# Patient Record
Sex: Female | Born: 1935 | Race: White | Hispanic: No | Smoking: Never smoker
Health system: Southern US, Community
[De-identification: ages and names within clinical notes are randomized; demographics above are authoritative.]

## PROBLEM LIST (undated history)

## (undated) DIAGNOSIS — I739 Peripheral vascular disease, unspecified: Secondary | ICD-10-CM

## (undated) DIAGNOSIS — M1990 Unspecified osteoarthritis, unspecified site: Secondary | ICD-10-CM

## (undated) DIAGNOSIS — E7800 Pure hypercholesterolemia, unspecified: Secondary | ICD-10-CM

## (undated) DIAGNOSIS — I10 Essential (primary) hypertension: Secondary | ICD-10-CM

## (undated) HISTORY — DX: Peripheral vascular disease, unspecified: I73.9

## (undated) HISTORY — PX: TONSILLECTOMY: SUR1361

## (undated) HISTORY — DX: Unspecified osteoarthritis, unspecified site: M19.90

## (undated) HISTORY — DX: Essential (primary) hypertension: I10

## (undated) HISTORY — DX: Pure hypercholesterolemia, unspecified: E78.00

---

## 2010-08-13 ENCOUNTER — Ambulatory Visit: Payer: Self-pay

## 2011-05-26 ENCOUNTER — Encounter (INDEPENDENT_AMBULATORY_CARE_PROVIDER_SITE_OTHER): Payer: Self-pay | Admitting: General Surgery

## 2011-05-26 ENCOUNTER — Ambulatory Visit (INDEPENDENT_AMBULATORY_CARE_PROVIDER_SITE_OTHER): Payer: Medicare Other | Admitting: General Surgery

## 2011-05-26 VITALS — BP 119/64 | HR 57 | Ht 61.0 in | Wt 137.0 lb

## 2011-05-26 DIAGNOSIS — K420 Umbilical hernia with obstruction, without gangrene: Secondary | ICD-10-CM

## 2011-05-26 NOTE — Progress Notes (Addendum)
Chief Complaint  Patient presents with  . hernia    HPI [place]. [place] is a 77 y.o. female.    This patient is referred to me through the courtesy of Dr. Leo Grosser at [facility]. She is referred for evaluation and surgical management of an incarcerated periumbilical ventral hernia.  The patient is 77 years old and has enjoyed fairly good health. She is originally from [place] but her daughter lives in [place]. She recently moved to [place] [facility] to be near her daughter. She denies any history of hernia. She denies any perception of mass or pain in her abdominal wall. When she went for a initial history and physical with Dr. Elease Hashimoto, Dr. Elease Hashimoto evaluated her hyperlipidemia, hypertension, and mild thrombocytopenia. The patient also has worsening arthritis. Dr. Elease Hashimoto also noted a palpable mass in the periumbilical area consistent with an incarcerated hernia.  The patient's complete metabolic panel and CBC are basically normal except for a platelet count of 119,000.  Abdominal ultrasound shows a palpable abnormality in the periumbilical area containing probable intestine. Small kidneys were noted. The aorta and vena cava looked normal. The gallbladder was normal. The liver looked normal. The bile duct was normal.  The patient remains asymptomatic but is here with her daughter for an evaluation. HPI  Past Medical History  Diagnosis Date  . Hypercholesterolemia   . Hypertension   . PAD (peripheral artery disease)   . Arthritis     hands    Past Surgical History  Procedure Date  . Tonsillectomy 77 years old    Family History  Problem Relation Age of Onset  . Heart attack Father   . Heart attack Brother   . Diabetes Daughter     Social History History  Substance Use Topics  . Smoking status: Never Smoker   . Smokeless tobacco: Not on file  . Alcohol Use: No    No Known Allergies  Current Outpatient Prescriptions  Medication Sig Dispense Refill    . aspirin 81 MG tablet Take 81 mg by mouth daily.        Marland Kitchen atenolol (TENORMIN) 100 MG tablet Take 100 mg by mouth daily.        Marland Kitchen atorvastatin (LIPITOR) 10 MG tablet Take 10 mg by mouth daily.        . hydrochlorothiazide 25 MG tablet Take 25 mg by mouth daily. Half tablet      . Multiple Vitamin (MULTIVITAMIN PO) Take by mouth daily.        Marland Kitchen oxybutynin (DITROPAN-XL) 5 MG 24 hr tablet Take 5 mg by mouth daily.        . Potassium 75 MG TABS Take by mouth daily.        . traMADol (ULTRAM) 50 MG tablet Take 50 mg by mouth every 6 (six) hours as needed.          Review of Systems Review of Systems  Constitutional: Negative.   HENT: Negative.   Eyes: Negative.   Respiratory: Negative.   Cardiovascular: Negative.   Gastrointestinal: Negative.   Genitourinary: Negative.   Musculoskeletal: Negative.   Skin: Negative.   Neurological: Negative.   Endo/Heme/Allergies: Negative.   Psychiatric/Behavioral: Negative.     Blood pressure 119/64, pulse 57, height 5\' 1"  (1.549 m), weight 137 lb (62.143 kg).  Physical Exam Physical Exam  Constitutional: She is oriented to person, place, and time. She appears well-developed and well-nourished. No distress.  HENT:  Head: Normocephalic and atraumatic.  Nose: Nose normal.  Mouth/Throat: No oropharyngeal exudate.       Dentures.  Eyes: Conjunctivae are normal. Pupils are equal, round, and reactive to light. Right eye exhibits no discharge. Left eye exhibits no discharge. No scleral icterus.  Neck: Normal range of motion. Neck supple. No JVD present. No tracheal deviation present. No thyromegaly present.  Cardiovascular: Normal rate, regular rhythm and normal heart sounds.   No murmur heard. Respiratory: Effort normal and breath sounds normal. No respiratory distress. She has no wheezes. She has no rales. She exhibits no tenderness.  GI: Soft. Bowel sounds are normal. She exhibits mass. She exhibits no distension. There is no tenderness. There is  no rebound and no guarding.    Musculoskeletal: She exhibits no edema and no tenderness.  Neurological: She is alert and oriented to person, place, and time. She exhibits normal muscle tone.  Skin: Skin is warm and dry. No rash noted. She is not diaphoretic. No erythema. No pallor.  Psychiatric: She has a normal mood and affect. Her behavior is normal. Judgment and thought content normal.     Data Reviewed I have reviewed Dr. Santiago Bur office notes, the lab work, and the ultrasound report.  Assessment    Incarcerated periumbilical ventral hernia. This is quite large and almost certainly contains nonobstructive bowel. She is at increased risk for obstruction and strangulation the future. Fortunately, she is asymptomatic today.  Hyperlipidemia.  Hypertension  Mild,  borderline thrombocytopenia.  History of venous thrombosis left leg, prior Coumadin therapy, none for 10 years.    Plan    I have advised the patient to consider elective open repair of her ventral hernia, probably with mesh. I have told her that she is at risk for emergency surgical problems from bowel obstruction and/or strangulation and gangrene.I told her it would be in her best interest to have this done electively rather than wait for an emergent surgical complication.  I have told her that I would like to do a CT scan of the abdomen and pelvis prior to this surgery to define the extent of the hernia, the nature of the bowel caught in the hernia, and to rule out other disorders since she has never had any GI workup in the past and has never had a colonoscopy.  I have discussed the indications and details of the surgery the patient and her daughter. Risks and complications have been outlined, including but not limited to bleeding, infection, recurrence of the hernia, and injury to the intestine was major reconstructive surgery, cardiac pulmonary and thromboembolic problems. She seems to understand all these issues quite  well. All of her questions were answered.  She is going to go home and think about this for 24-48 hours and call me back with her final decision. I suspect that she will eventually want to have this done electively, and we will be happy to provide that service.    ADDENDUM:    The CT scan performed on Sept. 4, 2012  shows a supraumbilical midline abdominal wall hernia containing fat only. This is incarcerated. She has a tiny right inguinal hernia containing fat only. She has sigmoid diverticulosis. Otherwise the CT scan is normal.  The patient's daughter has called and stated that she would like to have the patient scheduled for surgery.  We will go ahead and schedule her for open repair of her incarcerated ventral hernia with mesh. This can be scheduled at the patient's convenience.there is no indication for repair of the tiny right inguinal hernia.  Bran Aldridge M 05/26/2011, 3:00 PM

## 2011-05-26 NOTE — Patient Instructions (Addendum)
Call us when you are ready to schedule surgery. [phone]. Stay off any aspirin or blood thinning products for 5 days prior to surgery. If you decide to have the surgery, we will schedule you for a CT scan of the abdomen and pelvis prior to the surgery.

## 2011-05-29 ENCOUNTER — Telehealth (INDEPENDENT_AMBULATORY_CARE_PROVIDER_SITE_OTHER): Payer: Self-pay | Admitting: General Surgery

## 2011-05-29 NOTE — Telephone Encounter (Signed)
I have lmom for pts daughter that I am trying to return her call. Pt needs CT preop.

## 2011-06-01 ENCOUNTER — Telehealth (INDEPENDENT_AMBULATORY_CARE_PROVIDER_SITE_OTHER): Payer: Self-pay

## 2011-06-01 ENCOUNTER — Other Ambulatory Visit (INDEPENDENT_AMBULATORY_CARE_PROVIDER_SITE_OTHER): Payer: Self-pay | Admitting: General Surgery

## 2011-06-01 DIAGNOSIS — K439 Ventral hernia without obstruction or gangrene: Secondary | ICD-10-CM

## 2011-06-01 NOTE — Telephone Encounter (Signed)
Pts daughter aware CT at [facility] [phone] 9-4 @ 10:15 and to have labs drawn 8-31 at [facility] and pick up contrast.

## 2011-06-05 ENCOUNTER — Other Ambulatory Visit (INDEPENDENT_AMBULATORY_CARE_PROVIDER_SITE_OTHER): Payer: Self-pay | Admitting: General Surgery

## 2011-06-05 LAB — CREATININE, SERUM: Creat: 0.87 mg/dL (ref 0.50–1.10)

## 2011-06-09 ENCOUNTER — Ambulatory Visit
Admission: RE | Admit: 2011-06-09 | Discharge: 2011-06-09 | Disposition: A | Discharge status: Home / Self Care | Payer: Medicare Other | Source: Ambulatory Visit | Attending: General Surgery | Admitting: General Surgery

## 2011-06-09 DIAGNOSIS — K439 Ventral hernia without obstruction or gangrene: Secondary | ICD-10-CM

## 2011-06-09 MED ORDER — IOHEXOL 300 MG/ML  SOLN
100.0000 mL | Freq: Once | INTRAMUSCULAR | Status: AC | PRN
  Administered 2011-06-09: 100 mL via INTRAVENOUS

## 2011-06-10 ENCOUNTER — Telehealth (INDEPENDENT_AMBULATORY_CARE_PROVIDER_SITE_OTHER): Payer: Self-pay

## 2011-06-10 NOTE — Telephone Encounter (Signed)
I called to advise pts daughter, [place] that the CT was reviewed by Dr Derrell Lolling and we can proceed with ventral hernia repair. She advised me that pt now has an area on leg that she is concerned may be another thrombosis. I have advised her that pt needs to see PCP asap to have this evaluated. I advised this should be done before we schedule surgery. The daughter will have pt see her PCP and will have them call me when ok to proceed with surgery.

## 2011-06-17 ENCOUNTER — Telehealth (INDEPENDENT_AMBULATORY_CARE_PROVIDER_SITE_OTHER): Payer: Self-pay

## 2011-06-17 NOTE — Telephone Encounter (Signed)
I have left msg for Inocencio Homes, pts daughter to call with update on pts status [id number] up for thrombosis. We are still holding surgery orders awaiting call back.

## 2011-06-18 ENCOUNTER — Telehealth (INDEPENDENT_AMBULATORY_CARE_PROVIDER_SITE_OTHER): Payer: Self-pay

## 2011-06-18 NOTE — Telephone Encounter (Signed)
Pts daughter called to give update. Pt has appt for 9-21 to have possible new lower leg thrombosis workup. She will call back once pt is cleared for surgery.

## 2011-07-24 ENCOUNTER — Telehealth (INDEPENDENT_AMBULATORY_CARE_PROVIDER_SITE_OTHER): Payer: Self-pay | Admitting: General Surgery

## 2011-07-24 NOTE — Telephone Encounter (Signed)
Pt daughter Inocencio Homes) called from [phone]. Asked to speak with [facility]. She wanted to know if information from Dr. Marcha Dutton [facility]) sent any information over for review in order to set her up for surgery. Stated she had a history of varicose veins and clots. Advised her that Arline Asp was out of the office this afternoon, and that I would forward the information to her as she was the best person to answer that question. She stated she can be called back on cell # [phone] (to be tried first) or home # [phone].

## 2011-07-27 ENCOUNTER — Telehealth (INDEPENDENT_AMBULATORY_CARE_PROVIDER_SITE_OTHER): Payer: Self-pay

## 2011-07-27 NOTE — Telephone Encounter (Signed)
I have reviewed the system and find no clearance from Dr Marcha Dutton. Inocencio Homes advised to have there office send clearance note [id number] leg r/s dvt . She will have them fax clearance note.

## 2011-08-17 ENCOUNTER — Encounter (INDEPENDENT_AMBULATORY_CARE_PROVIDER_SITE_OTHER): Payer: Medicare Other | Admitting: General Surgery

## 2011-08-18 ENCOUNTER — Ambulatory Visit (HOSPITAL_COMMUNITY): Admission: RE | Admit: 2011-08-18 | Payer: Medicare Other | Source: Ambulatory Visit | Admitting: General Surgery

## 2011-08-18 ENCOUNTER — Encounter (HOSPITAL_COMMUNITY): Admission: RE | Payer: Self-pay | Source: Ambulatory Visit

## 2011-08-18 SURGERY — REPAIR, HERNIA, VENTRAL
Anesthesia: General

## 2012-05-19 ENCOUNTER — Ambulatory Visit: Payer: Self-pay | Admitting: Family Medicine

## 2012-06-15 ENCOUNTER — Ambulatory Visit: Payer: Self-pay | Admitting: Vascular Surgery

## 2012-08-25 ENCOUNTER — Emergency Department: Payer: Self-pay | Admitting: Emergency Medicine

## 2012-08-25 LAB — COMPREHENSIVE METABOLIC PANEL
Albumin: 4 g/dL (ref 3.4–5.0)
Alkaline Phosphatase: 64 U/L (ref 50–136)
Calcium, Total: 9.7 mg/dL (ref 8.5–10.1)
Chloride: 104 mmol/L (ref 98–107)
Co2: 33 mmol/L — ABNORMAL HIGH (ref 21–32)
SGOT(AST): 30 U/L (ref 15–37)
SGPT (ALT): 19 U/L (ref 12–78)

## 2012-08-25 LAB — CBC
HCT: 39 % (ref 35.0–47.0)
MCV: 87 fL (ref 80–100)
WBC: 6.6 10*3/uL (ref 3.6–11.0)

## 2012-08-25 LAB — URINALYSIS, COMPLETE
Nitrite: NEGATIVE
Protein: NEGATIVE
Specific Gravity: 1.014 (ref 1.003–1.030)

## 2012-08-25 LAB — CK TOTAL AND CKMB (NOT AT ARMC)
CK, Total: 80 U/L (ref 21–215)
CK-MB: 2 ng/mL (ref 0.5–3.6)

## 2012-08-27 LAB — URINE CULTURE

## 2012-09-05 ENCOUNTER — Ambulatory Visit: Payer: Self-pay | Admitting: Vascular Surgery

## 2012-09-05 LAB — CBC
HCT: 36.6 % (ref 35.0–47.0)
HGB: 12.5 g/dL (ref 12.0–16.0)
MCV: 87 fL (ref 80–100)
Platelet: 113 10*3/uL — ABNORMAL LOW (ref 150–440)

## 2012-09-05 LAB — BASIC METABOLIC PANEL
Chloride: 106 mmol/L (ref 98–107)
Co2: 30 mmol/L (ref 21–32)
Creatinine: 0.69 mg/dL (ref 0.60–1.30)

## 2012-09-09 ENCOUNTER — Inpatient Hospital Stay: Payer: Self-pay | Admitting: Vascular Surgery

## 2012-09-09 LAB — POTASSIUM: Potassium: 3.3 mmol/L — ABNORMAL LOW (ref 3.5–5.1)

## 2012-09-10 LAB — CBC WITH DIFFERENTIAL/PLATELET
MCV: 86 fL (ref 80–100)
Monocyte %: 7.7 %
Platelet: 100 10*3/uL — ABNORMAL LOW (ref 150–440)
RDW: 13.7 % (ref 11.5–14.5)
WBC: 10.4 10*3/uL (ref 3.6–11.0)

## 2012-09-10 LAB — BASIC METABOLIC PANEL
Calcium, Total: 8.2 mg/dL — ABNORMAL LOW (ref 8.5–10.1)
Co2: 30 mmol/L (ref 21–32)
EGFR (African American): >60
Sodium: 144 mmol/L (ref 136–145)

## 2012-09-12 LAB — PATHOLOGY REPORT

## 2012-09-14 ENCOUNTER — Observation Stay: Payer: Self-pay | Admitting: Vascular Surgery

## 2012-09-15 LAB — CBC WITH DIFFERENTIAL/PLATELET
Eosinophil %: 0.3 %
HCT: 29.7 % — ABNORMAL LOW (ref 35.0–47.0)
Lymphocyte #: 0.8 10*3/uL — ABNORMAL LOW (ref 1.0–3.6)
MCH: 30.1 pg (ref 26.0–34.0)
MCV: 87 fL (ref 80–100)
Monocyte #: 0.6 x10 3/mm (ref 0.2–0.9)
Neutrophil #: 5.2 10*3/uL (ref 1.4–6.5)
Platelet: 122 10*3/uL — ABNORMAL LOW (ref 150–440)
RBC: 3.43 10*6/uL — ABNORMAL LOW (ref 3.80–5.20)
RDW: 13.4 % (ref 11.5–14.5)

## 2012-09-15 LAB — BASIC METABOLIC PANEL
BUN: 11 mg/dL (ref 7–18)
Creatinine: 0.71 mg/dL (ref 0.60–1.30)
EGFR (African American): >60
EGFR (Non-African Amer.): >60
Potassium: 4.2 mmol/L (ref 3.5–5.1)
Sodium: 141 mmol/L (ref 136–145)

## 2012-09-16 LAB — BASIC METABOLIC PANEL
Anion Gap: 6 — ABNORMAL LOW (ref 7–16)
BUN: 6 mg/dL — ABNORMAL LOW (ref 7–18)
Calcium, Total: 8.9 mg/dL (ref 8.5–10.1)
EGFR (African American): >60
EGFR (Non-African Amer.): >60
Osmolality: 285 (ref 275–301)

## 2012-09-16 LAB — CBC WITH DIFFERENTIAL/PLATELET
Basophil %: 0.2 %
Eosinophil #: 0 10*3/uL (ref 0.0–0.7)
Eosinophil %: 0.1 %
HCT: 32.5 % — ABNORMAL LOW (ref 35.0–47.0)
HGB: 11 g/dL — ABNORMAL LOW (ref 12.0–16.0)
Lymphocyte %: 15.3 %
MCH: 29.3 pg (ref 26.0–34.0)
MCHC: 33.9 g/dL (ref 32.0–36.0)
Neutrophil #: 5.4 10*3/uL (ref 1.4–6.5)
RBC: 3.76 10*6/uL — ABNORMAL LOW (ref 3.80–5.20)

## 2014-02-21 IMAGING — CT CT HEAD WITHOUT CONTRAST
2 series · 16 of 30 positions shown, 20 images · non-contrast
Comparison: none

REASON FOR EXAM: weakness/confusion
COMMENTS:

PROCEDURE:     CT  - CT HEAD WITHOUT CONTRAST  - August 25, 2012  [DATE]
RESULT:     Comparison:  None
TECHNIQUE: Multiple axial images from the foramen magnum to the vertex were
obtained without IV contrast.

[Series 2: without · axial · non-contrast · 0.39mm/px · z∈[+6,+126]mm · 13 of 29 slices shown, 17 images]
[im 3/29  brain]
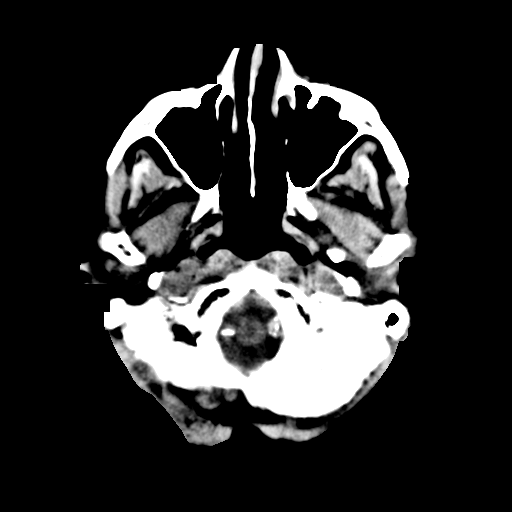
[im 3/29  bone]
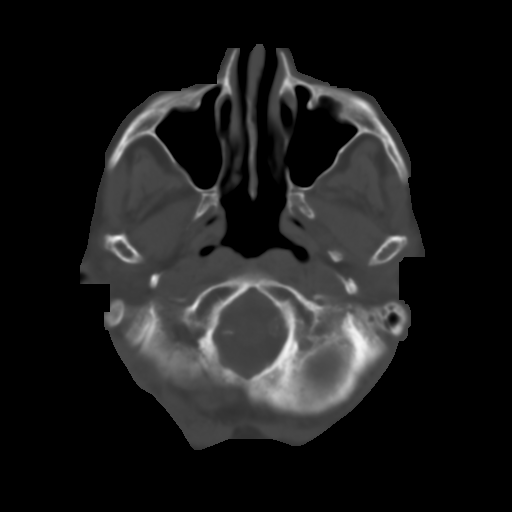
[im 5/29  brain]
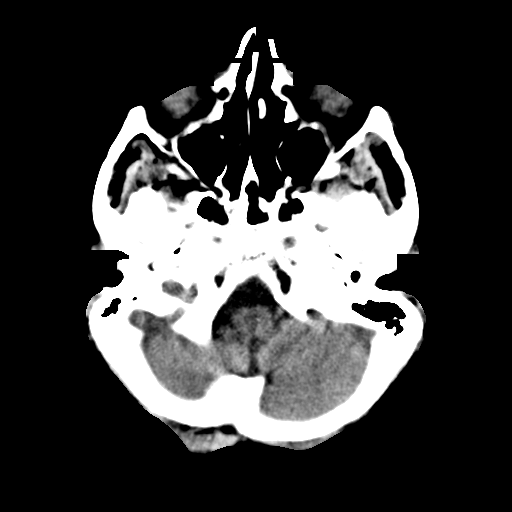
[im 7/29  brain]
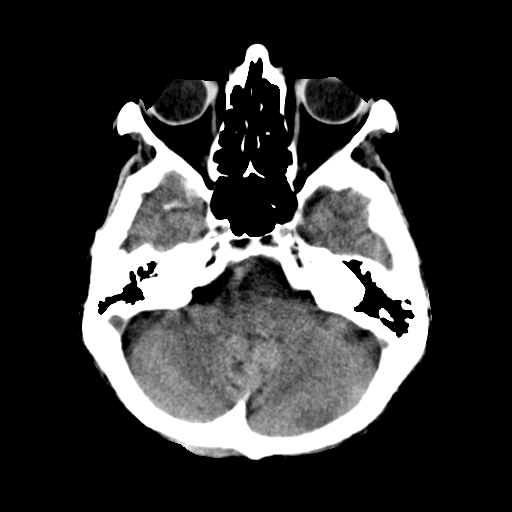
[im 9/29  brain]
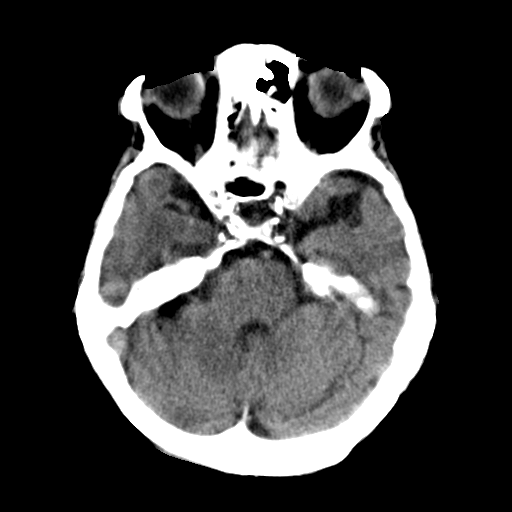
[im 11/29  brain]
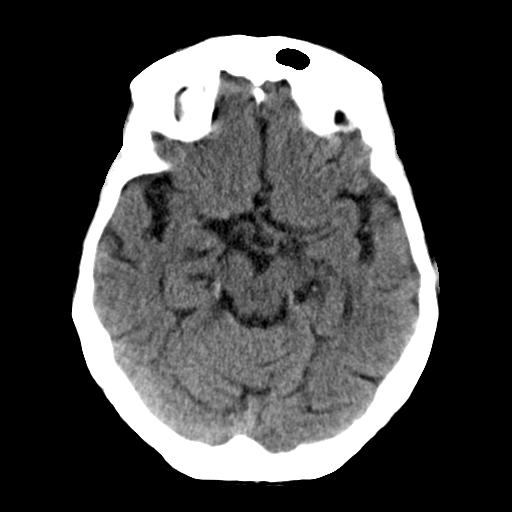
[im 11/29  bone]
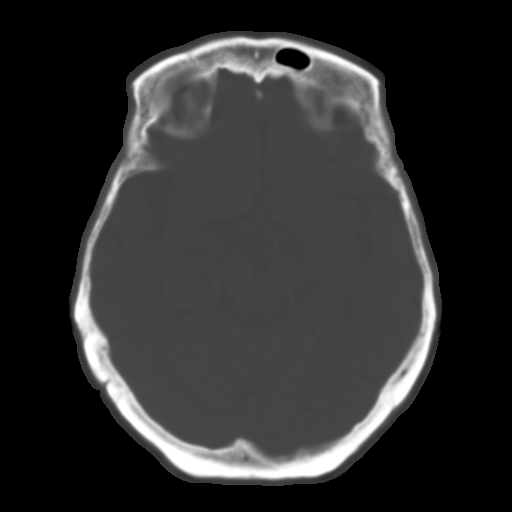
[im 13/29  brain]
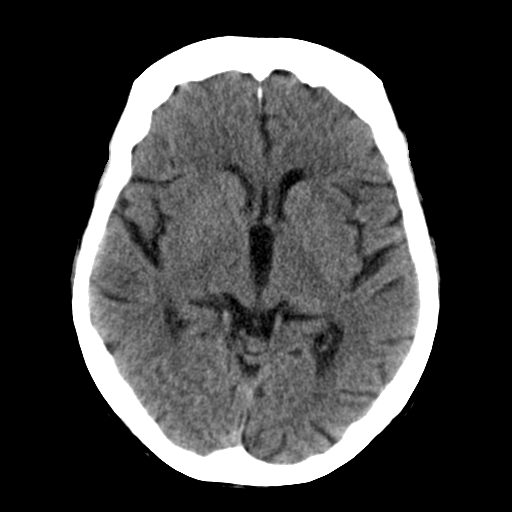
[im 15/29  brain]
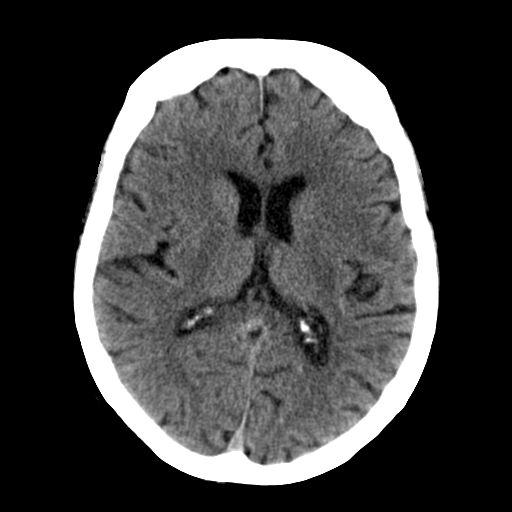
[im 17/29  brain]
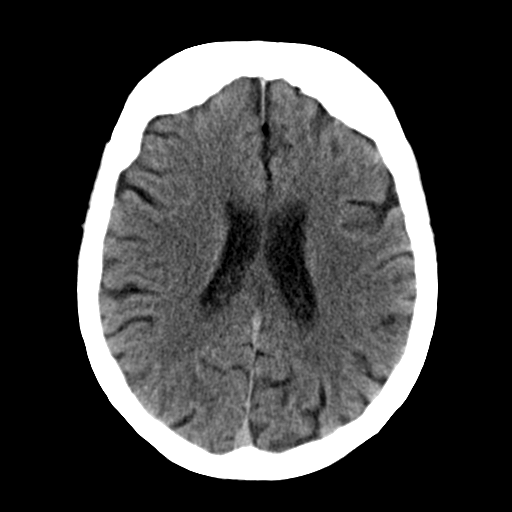
[im 19/29  brain]
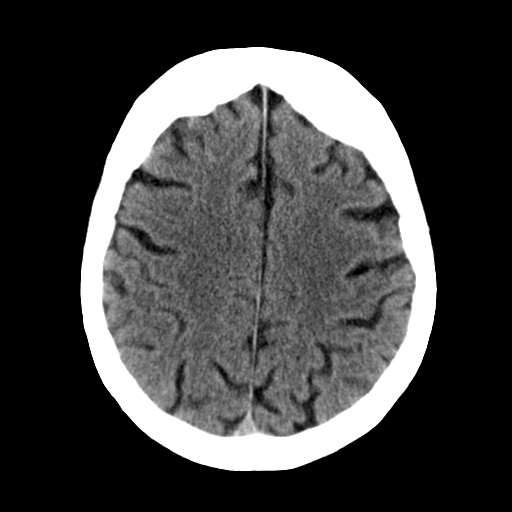
[im 19/29  bone]
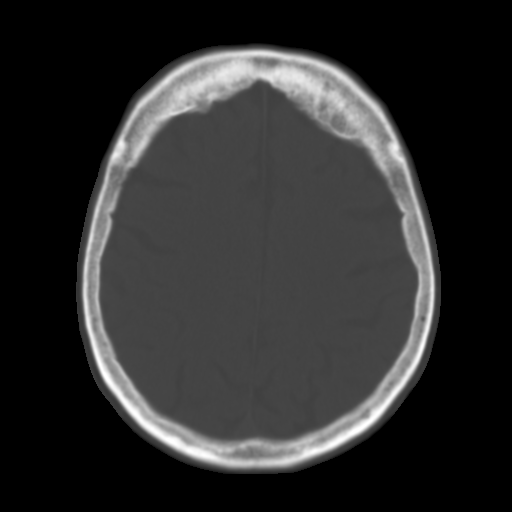
[im 21/29  brain]
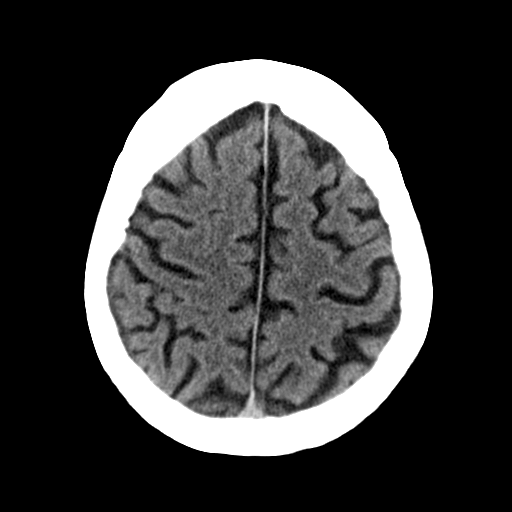
[im 23/29  brain]
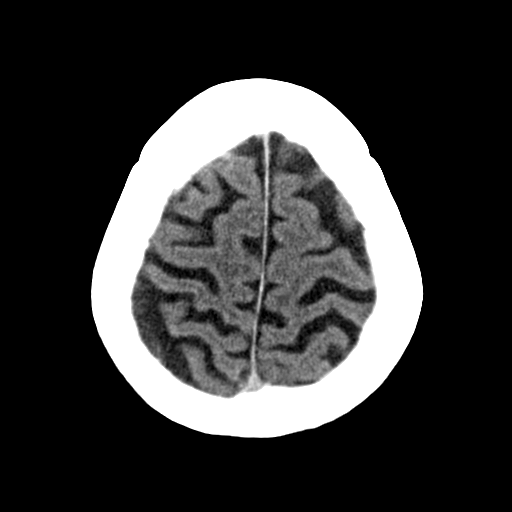
[im 25/29  brain]
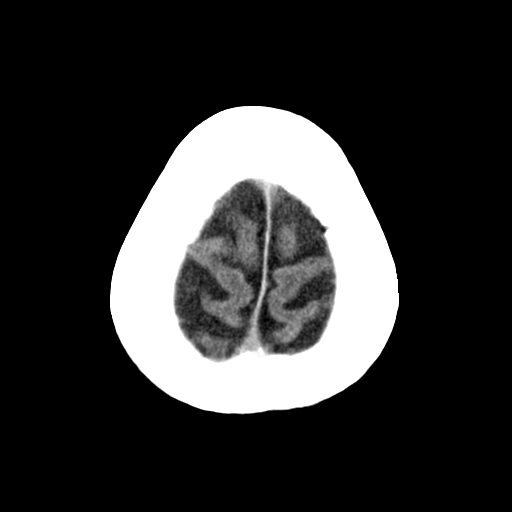
[im 27/29  brain]
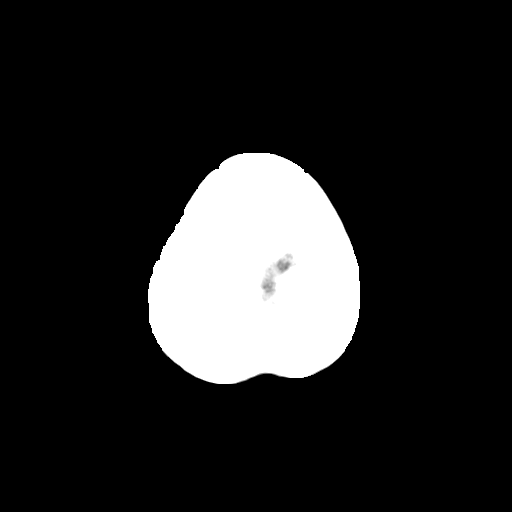
[im 27/29  bone]
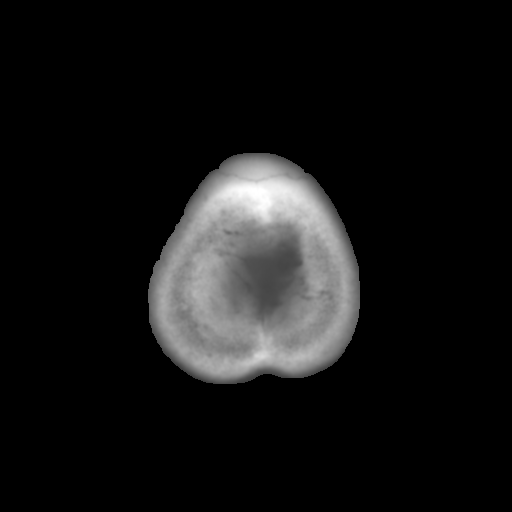

[Series 3: bone · axial · 0.39mm/px · z∈[+6,+46]mm · 3 of 29 slices shown]
[im 3/29  bone]
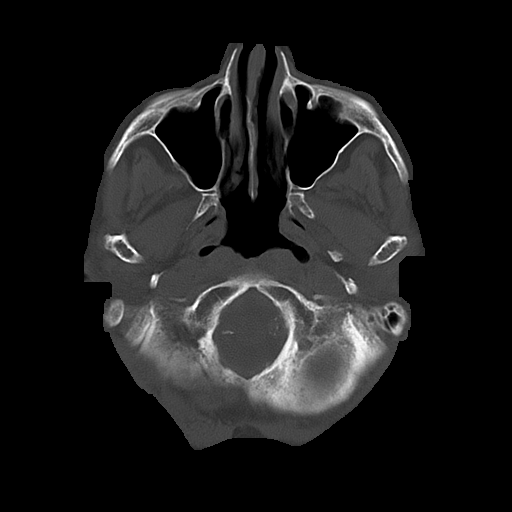
[im 7/29  bone]
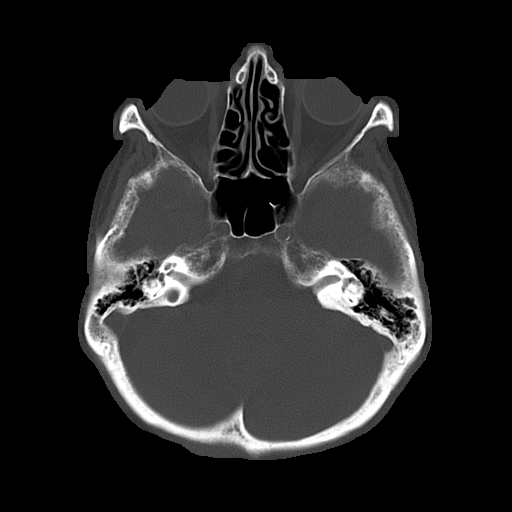
[im 11/29  bone]
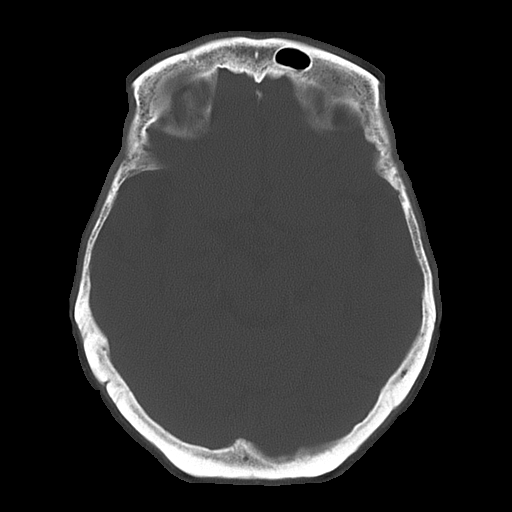

[16 of 30 positions shown; findings below may reference images not displayed]

FINDINGS: There is no evidence for mass effect, midline shift, or extra-axial fluid
collections. There is no evidence for space-occupying lesion, intracranial
hemorrhage, or cortical-based area of infarction. Minimal periventricular
hypoattenuation in likely represent sequela of chronic microangiopathy.
Curvilinear areas of hyperattenuation along the periphery of the pons are
likely secondary to calcifications and appear to extend from the cerebellar
tentorium.

The osseous structures are unremarkable.
IMPRESSION: No acute intracranial process.

[REDACTED]

## 2014-10-05 ENCOUNTER — Ambulatory Visit: Payer: Self-pay | Admitting: Internal Medicine

## 2014-10-21 ENCOUNTER — Inpatient Hospital Stay: Payer: Self-pay | Admitting: Internal Medicine

## 2014-10-21 LAB — URINALYSIS, COMPLETE
BILIRUBIN, UR: NEGATIVE
GLUCOSE, UR: NEGATIVE mg/dL (ref 0–75)
Leukocyte Esterase: NEGATIVE
Nitrite: POSITIVE
PH: 5 (ref 4.5–8.0)
Protein: NEGATIVE
Specific Gravity: 1.019 (ref 1.003–1.030)
WBC UR: 3 /HPF (ref 0–5)

## 2014-10-21 LAB — BASIC METABOLIC PANEL
ANION GAP: 9 (ref 7–16)
BUN: 52 mg/dL — AB (ref 7–18)
CALCIUM: 9.7 mg/dL (ref 8.5–10.1)
CHLORIDE: 95 mmol/L — AB (ref 98–107)
Co2: 33 mmol/L — ABNORMAL HIGH (ref 21–32)
Creatinine: 1.12 mg/dL (ref 0.60–1.30)
EGFR (African American): 59 — ABNORMAL LOW
EGFR (Non-African Amer.): 49 — ABNORMAL LOW
Glucose: 115 mg/dL — ABNORMAL HIGH (ref 65–99)
OSMOLALITY: 289 (ref 275–301)
Potassium: 3 mmol/L — ABNORMAL LOW (ref 3.5–5.1)
Sodium: 137 mmol/L (ref 136–145)

## 2014-10-21 LAB — CBC
HCT: 46 % (ref 35.0–47.0)
HGB: 15.1 g/dL (ref 12.0–16.0)
MCH: 28.6 pg (ref 26.0–34.0)
MCHC: 32.8 g/dL (ref 32.0–36.0)
MCV: 87 fL (ref 80–100)
PLATELETS: 169 10*3/uL (ref 150–440)
RBC: 5.27 10*6/uL — ABNORMAL HIGH (ref 3.80–5.20)
RDW: 14.1 % (ref 11.5–14.5)
WBC: 15.9 10*3/uL — AB (ref 3.6–11.0)

## 2014-10-22 LAB — CBC WITH DIFFERENTIAL/PLATELET
BASOS PCT: 0.2 %
Basophil #: 0 10*3/uL (ref 0.0–0.1)
EOS ABS: 0 10*3/uL (ref 0.0–0.7)
Eosinophil %: 0.1 %
HCT: 45 % (ref 35.0–47.0)
HGB: 14.8 g/dL (ref 12.0–16.0)
LYMPHS PCT: 11.7 %
Lymphocyte #: 1.6 10*3/uL (ref 1.0–3.6)
MCH: 28.8 pg (ref 26.0–34.0)
MCHC: 32.9 g/dL (ref 32.0–36.0)
MCV: 88 fL (ref 80–100)
Monocyte #: 1.5 x10 3/mm — ABNORMAL HIGH (ref 0.2–0.9)
Monocyte %: 11.1 %
NEUTROS PCT: 76.9 %
Neutrophil #: 10.2 10*3/uL — ABNORMAL HIGH (ref 1.4–6.5)
PLATELETS: 158 10*3/uL (ref 150–440)
RBC: 5.14 10*6/uL (ref 3.80–5.20)
RDW: 13.7 % (ref 11.5–14.5)
WBC: 13.2 10*3/uL — AB (ref 3.6–11.0)

## 2014-10-22 LAB — BASIC METABOLIC PANEL
Anion Gap: 8 (ref 7–16)
BUN: 46 mg/dL — ABNORMAL HIGH (ref 7–18)
CALCIUM: 9.3 mg/dL (ref 8.5–10.1)
CHLORIDE: 98 mmol/L (ref 98–107)
CO2: 34 mmol/L — AB (ref 21–32)
Creatinine: 1.04 mg/dL (ref 0.60–1.30)
EGFR (Non-African Amer.): 53 — ABNORMAL LOW
Glucose: 89 mg/dL (ref 65–99)
OSMOLALITY: 291 (ref 275–301)
POTASSIUM: 3 mmol/L — AB (ref 3.5–5.1)
Sodium: 140 mmol/L (ref 136–145)

## 2014-10-22 LAB — MAGNESIUM: MAGNESIUM: 2.1 mg/dL

## 2014-10-23 LAB — CBC WITH DIFFERENTIAL/PLATELET
BASOS PCT: 0.5 %
Basophil #: 0 10*3/uL (ref 0.0–0.1)
EOS ABS: 0 10*3/uL (ref 0.0–0.7)
Eosinophil %: 0.3 %
HCT: 40.8 % (ref 35.0–47.0)
HGB: 13.6 g/dL (ref 12.0–16.0)
LYMPHS PCT: 17.2 %
Lymphocyte #: 1.5 10*3/uL (ref 1.0–3.6)
MCH: 29.4 pg (ref 26.0–34.0)
MCHC: 33.3 g/dL (ref 32.0–36.0)
MCV: 88 fL (ref 80–100)
MONO ABS: 1.1 x10 3/mm — AB (ref 0.2–0.9)
Monocyte %: 12.5 %
NEUTROS ABS: 6.3 10*3/uL (ref 1.4–6.5)
Neutrophil %: 69.5 %
Platelet: 138 10*3/uL — ABNORMAL LOW (ref 150–440)
RBC: 4.62 10*6/uL (ref 3.80–5.20)
RDW: 13.9 % (ref 11.5–14.5)
WBC: 9 10*3/uL (ref 3.6–11.0)

## 2014-10-23 LAB — BASIC METABOLIC PANEL
ANION GAP: 5 — AB (ref 7–16)
BUN: 32 mg/dL — ABNORMAL HIGH (ref 7–18)
CALCIUM: 8.5 mg/dL (ref 8.5–10.1)
CO2: 29 mmol/L (ref 21–32)
Chloride: 108 mmol/L — ABNORMAL HIGH (ref 98–107)
Creatinine: 0.79 mg/dL (ref 0.60–1.30)
GLUCOSE: 86 mg/dL (ref 65–99)
OSMOLALITY: 289 (ref 275–301)
Potassium: 4.5 mmol/L (ref 3.5–5.1)
SODIUM: 142 mmol/L (ref 136–145)

## 2014-10-24 LAB — BASIC METABOLIC PANEL
Anion Gap: 6 — ABNORMAL LOW (ref 7–16)
BUN: 17 mg/dL (ref 7–18)
Calcium, Total: 8.4 mg/dL — ABNORMAL LOW (ref 8.5–10.1)
Chloride: 107 mmol/L (ref 98–107)
Co2: 28 mmol/L (ref 21–32)
Creatinine: 0.74 mg/dL (ref 0.60–1.30)
EGFR (African American): >60
EGFR (Non-African Amer.): >60
Glucose: 87 mg/dL (ref 65–99)
Osmolality: 282 (ref 275–301)
Potassium: 3.9 mmol/L (ref 3.5–5.1)
Sodium: 141 mmol/L (ref 136–145)

## 2014-10-24 LAB — URINE CULTURE

## 2014-10-26 LAB — CULTURE, BLOOD (SINGLE)

## 2014-10-27 LAB — CULTURE, BLOOD (SINGLE)

## 2014-11-05 ENCOUNTER — Ambulatory Visit: Payer: Self-pay | Admitting: Internal Medicine

## 2014-12-25 ENCOUNTER — Ambulatory Visit: Payer: Self-pay | Admitting: Orthopedic Surgery

## 2014-12-25 DIAGNOSIS — I10 Essential (primary) hypertension: Secondary | ICD-10-CM | POA: Diagnosis not present

## 2014-12-27 ENCOUNTER — Ambulatory Visit: Payer: Self-pay | Admitting: Orthopedic Surgery

## 2015-01-22 NOTE — Discharge Summary (Signed)
PATIENT NAME:  [place], [place] MR#:  [id number] DATE OF BIRTH:  09-11-1923  DATE OF ADMISSION:  09/09/2012 DATE OF DISCHARGE:  09/11/2012  DIAGNOSES: Critical stenosis of the right internal carotid artery.   PROCEDURE PERFORMED: Right carotid endarterectomy with CorMatrix patch angioplasty.   HISTORY: [place] is an 81 year old woman who presented in her routine follow up and was found to have critical changes of her right internal carotid artery stenosis. She subsequently underwent preoperative workup, cardiac clearance and is scheduled for right carotid endarterectomy.   HOSPITAL COURSE: On the day of admission, the patient underwent successful right carotid endarterectomy as described in the operative report. Postoperatively she had a stable course with the exception of some mild hypotension with systolic blood pressures ranging in the high 80s to low 90s. Her routine antihypertensive medications were held on postoperative day one. On postoperative day two she was found to have systolics consistently between 100 and 110 and is awake, alert, oriented, ambulating independently around the nurses station. She is tolerating regular diet. She is neurologically intact and without complaints. She is therefore fit for discharge. She is discharged to home. She will continue her diet as scheduled. She will continue her home medications with the exception of holding the hydrochlorothiazide and the atenolol. She will follow up with Dr. Elease HashimotoMaloney in the next several days for a blood pressure recheck and at that point Dr. Elease HashimotoMaloney would be a gracious enough to guide her in her antihypertensive therapy. She will follow up with me in 7 to 10 days for wound check and postoperative evaluation.   ____________________________ Renford DillsGregory G. Schnier, MD ggs:cms D: 09/11/2012 11:00:54 ET T: 09/11/2012 12:42:30 ET JOB#: [id number]  cc: Renford DillsGregory G. Schnier, MD, <Dictator> Leo GrosserNancy [place]. Maloney, MD  Renford DillsGREGORY G SCHNIER  MD ELECTRONICALLY SIGNED 09/21/2012 16:23

## 2015-01-22 NOTE — Op Note (Signed)
PATIENT NAME:  [place], [place] MR#:  [id number] DATE OF BIRTH:  [birth year]  DATE OF PROCEDURE:  09/09/2012  PREOPERATIVE DIAGNOSIS: Critical stenosis of the right internal carotid artery.   POSTOPERATIVE DIAGNOSIS: Critical stenosis of the right internal carotid artery.    PROCEDURES PERFORMED:  1. Right carotid endarterectomy with CorMatrix patch angioplasty.  2. Repair of arterial defect with xenograft patch, CorMatrix type.   SURGEON: Renford DillsGregory G. Shruthi Northrup, M.D.   ANESTHESIA: General by endotracheal intubation.   FLUIDS: Per anesthesia record.  ESTIMATED BLOOD LOSS: 100 mL.  SPECIMEN: Plaque to pathology for permanent section.   INDICATIONS: Ms. [place] is an 81 year old woman who presents with carotid stenosis. Risks and benefits for carotid surgery were reviewed, all questions answered. The patient agrees to proceed.   DESCRIPTION OF PROCEDURE: The patient is taken to the Operating Room and placed in the supine position. After adequate general anesthesia is induced, appropriate invasive monitors are placed. The patient is positioned with her neck extended rotated to the left. She is then prepped and draped in a sterile fashion. Appropriate time-out is then called.   A curvilinear incision is then created along the anterior margin of the sternocleidomastoid muscle and carried down through the soft tissues transecting the platysma. External jugular vein was ligated between 2-0 silk ties. Omohyoid was then identified and the dissection was carried deep at this level when identifying the common carotid artery. Dissection was then carried along the anterior margin of the carotid artery in a craniad direction encountering the bulb. Facial vein was ligated between 2-0 silk ties. Superior thyroid was then looped with a blue Silastic vessel loop. The external carotid artery was looped with an orange silastic vessel loop. Dissection was then carried along the anterior margin of the internal carotid artery  until a level above visible plaque formation. 7,000 units of heparin is given and allowed to circulate for four minutes.   The common carotid followed by the external followed by the internal carotid artery was clamped. Arteriotomy was made and extended with Potts scissors and an indwelling Sundt shunt was placed without difficulty. Flow was then re-established to the brain.  Endarterectomy was then performed under direct visualization for the common carotid, the bulb, and the internal carotid artery. External carotid artery was treated with the eversion technique. Using six 7-0 Prolene in an interrupted fashion, approximately 1 cm of the internal carotid artery was plicated, shortening the artery and reducing the redundancy. CorMatrix patch angioplasty was then rehydrated on the back field. It was tapered and then applied to the arterial defect using running 6-0 Prolene in a four-quadrant technique. Flushing maneuvers were performed. The shunt was removed without difficulty and the suture line was completed. Flow was then re-established to the external carotid artery and then the internal carotid artery to prevent distal embolization. Several areas along the suture line were noted to be oozing slightly and, therefore, interrupted 6-0 Prolene were utilized to easily control these spots. After inspection of the suture line, the pressure was allowed to climb to a systolic of approximately 130. Suture line was hemostatic and therefore the wound was irrigated and meticulously inspected for hemostasis. Once this was achieved, the platysma was reapproximated using running 3-0 Vicryl. The skin was reapproximated with 4-0 Monocryl subcuticular and then Dermabond was applied. The patient tolerated the procedure well. She was awakened in the Operating Room following simple commands and taken to the recovery area in excellent condition.   ____________________________ Renford DillsGregory G. Lannette Avellino, MD ggs:ap D:  09/09/2012 10:16:08  ET T: 09/09/2012 10:34:28 ET JOB#: [id number]  cc: Renford Dills, MD, <Dictator> Leo Grosser, MD Renford Dills MD ELECTRONICALLY SIGNED 09/21/2012 16:23

## 2015-01-28 LAB — SURGICAL PATHOLOGY

## 2015-02-03 NOTE — Consult Note (Signed)
PATIENT NAME:  [place], [name] MR#:  [id number] DATE OF BIRTH:  Feb 27, 1923  DATE OF CONSULTATION:  10/22/2014  REFERRING PHYSICIAN:   CONSULTING PHYSICIAN:  Derryl HarborJeff J. Maraya Gwilliam, MD  HISTORY OF PRESENT ILLNESS:  I have been asked by Dr. Clint GuyHower to evaluate this pleasant woman for lower back pain. Briefly, she is an 81 year old female with a history of hypertension, hyperlipidemia, and early dementia, who apparently lives with her family. Apparently, according to the patient, she turned suddenly to do an activity when she felt increased pain in her lower back. This occurred approximately one week ago. Over the past week, the patient apparently was becoming less and less active due to her pain. She was taken to her primary care provider by her family who prescribed tramadol which did not provide any significant pain. Because of her continued pain and poor p.o. intake, she was brought to the Emergency Room yesterday where x-rays demonstrated an L2 compression fracture. The patient denies any associated injury, nor does she recall any chest pain, shortness of breath, lightheadedness, or other symptoms occurring in conjunction with or predisposing to this injury. The patient denies any numbness or paresthesias to either lower extremity, nor does she note any bowel or bladder complaints. Her pain is worse with activity.   PAST MEDICAL HISTORY:  As noted above.    PHYSICAL EXAMINATION:  On examination, we have a pleasant elderly female resting comfortably in bed. She appears to be alert and appropriately responsive to questions. Examination of her back demonstrates tenderness to palpation over the mid and lower lumbar spine. Skin inspection of her back is unremarkable. Examination of both lower extremities demonstrates 4 +-5/5 strength in all groups in both lower extremities. Sensation is intact to light touch to all distributions of both lower extremities. She has good capillary refill to both feet. She has pain-free  motion of both hips, knees, ankles, and feet. Skin inspection of both lower extremities is notable only for a contusion on the medial aspect of her left lower leg, but otherwise is unremarkable.   X-ray data are as noted above. The L2 compression fracture appears to be impacted approximately 20-25%. There does not appear to be any significant retropulsion or significant angulation.   IMPRESSION: Mildly impacted L2 compression fracture.   PLAN: The treatment options are discussed with the patient and her granddaughter who is at the bedside. We will attempt to treat this injury nonsurgically, as most of these do heal uneventfully. She will be fitted with a lumbosacral corset which she is to wear whenever she is out of bed to provide additional support to the mid and lower lumbar spine. In addition, physical therapy will be consult to help with gait training and to be sure that she is safe for potential discharge home once cleared by physical therapy. I will be happy to see the patient in my office in 2-3 weeks for re-evaluation.   Thank you for asking to participate in the care of this most pleasant woman. I will be happy to follow her with you.    ____________________________ J. Derald MacleodJeffrey Gisele Pack, MD jjp:bu D: 10/22/2014 13:35:44 ET T: 10/22/2014 13:55:33 ET JOB#: [id number]  cc: Maryagnes AmosJ. Jeffrey Shemiah Rosch, MD, <Dictator> JEFF Fidel LevyJ Gayle Martinez MD ELECTRONICALLY SIGNED 10/23/2014 14:59

## 2015-02-03 NOTE — Op Note (Signed)
PATIENT NAME:  [place], [place] MR#:  [id number] DATE OF BIRTH:  [birth year]  DATE OF PROCEDURE:  12/27/2014  PREOPERATIVE DIAGNOSIS:  T12 and L2 compression fractures.   POSTOPERATIVE DIAGNOSIS:  T12 and L2 compression fractures.  PROCEDURE:  T12 and L2 kyphoplasty.   ANESTHESIA:  MAC.   SURGEON:  Leitha SchullerMichael Boyd. Lynnelle Mesmer, MD   DESCRIPTION OF PROCEDURE:  The patient was brought to the operating room, and after adequate anesthesia was obtained, the patient was placed prone. C-arm was brought on both AP and lateral projections with good visualization of both levels at the same time. The initial wedge deformity was markedly improved with this positioning. The timeout procedure was completed, and 2.5 mL of 1% Xylocaine was infiltrated subcutaneously on the right and left at T12 and L1. Next, the back was reprepped and draped and repeat timeout procedure completed. A spinal needle was used on each side, infiltrating a mixture of 0.5% Sensorcaine with epinephrine and Xylocaine down to the bone. The right side was addressed first at T12. A small stab incision was made and trocar advanced, care being taken to not approach the medial pedicle wall. A biopsy was obtained, drilling carried out, and with balloon inflation, the balloon crossed the midline, so a left-sided stick was not required. There was very good fill of the defect with bone cement subsequently. The L2 level was approached in an identical fashion, again getting crossover with the balloon and not needing the contralateral side opened. Despite preoperative plan to do so, it was not required. Biopsy was also obtained at L2. On cement filling, both sides had very good fill and interdigitation with significant correction of the kyphotic deformity. The wounds were closed with Dermabond and covered with Band-Aids. The patient was sent to the recovery room in stable condition.   ESTIMATED BLOOD LOSS:  Minimal.   COMPLICATIONS:  None.   SPECIMEN:  T12 and L2  vertebral body biopsy.    ____________________________ Leitha SchullerMichael Boyd. Delorean Knutzen, MD mjm:nb D: 12/27/2014 17:00:42 ET T: 12/28/2014 04:00:32 ET JOB#: [id number]  cc: Leitha SchullerMichael Boyd. Bayan Hedstrom, MD, <Dictator> Leitha SchullerMICHAEL Boyd Longino Trefz MD ELECTRONICALLY SIGNED 01/01/2015 16:54

## 2015-02-03 NOTE — Consult Note (Signed)
PATIENT NAME:  Kelly Boyd, Kelly Boyd MR#:  [id number] DATE OF BIRTH:  08-21-1923  DATE OF CONSULTATION:  10/23/2014  REFERRING PHYSICIAN: Dr. Imogene Burnhen  CONSULTING PHYSICIAN:  Stann Mainlandavid P. Sampson GoonFitzgerald, MD  REASON FOR CONSULTATION: Urinary tract infection with gram-negative rod bacteremia.   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 She is actually quite well physically. She was brought in with progressive back pain and some altered mental status. Family states she had been having some pain in her back for about 1 week. She also started to have decreased p.o. intake. She was seen in the outpatient facility and told she has sciatica. When she was brought back in and she was found to likely have a urinary tract infection and admitted. Blood cultures are growing gram-negative. Clinically, she is improved and remains hemodynamically stable.   Most of the history is obtained from family at the bedside, due to her advanced dementia.   PAST MEDICAL HISTORY:  1.  Alzheimer dementia.  2.  Hypertension.  3.  Hyperlipidemia.   SOCIAL HISTORY: Lives with the son and his wife. No tobacco, alcohol or drugs.   FAMILY HISTORY: Noncontributory.   ALLERGIES: No known drug allergies.  ANTIBIOTICS SINCE ADMISSION: Include ceftriaxone begun January 16.  REVIEW OF SYSTEMS: Unable to be obtained.   PHYSICAL EXAMINATION:  VITAL SIGNS: Temperature 97.3, pulse 70, blood pressure 152/83, respirations 18, saturation 92% on room air.  GENERAL: She is pleasant, but has advanced dementia. She is able to converse, but not in a very meaningful way.  HEENT: Pupils reactive. Sclerae are anicteric. Oropharynx clear.  NECK: Supple.  HEART: Regular.  LUNGS: Clear.  ABDOMEN: Soft, nontender. No hepatosplenomegaly.  EXTREMITIES: No clubbing, cyanosis or edema.  NEUROLOGIC: Advanced dementia.    DATA: White blood count on admission was 15.9, currently 9.0, hemoglobin 13.6, platelets 138,000. Blood cultures 1 of 2 growing gram-negative rods. Urine culture greater than 100,000 gram-negative rods. Urinalysis had 3 white blood cells, negative leukocyte esterase, positive nitrites. Renal function on admission showed creatinine 1.12, now down to 0.79.   IMAGING: Chest x-ray: No acute disease, lumbar spine with an L2 age-indeterminate compression fracture.   IMPRESSION: This is an 81 year old with dementia admitted with back and hip pain as well as some confusion and decreased p.o. intake. She has gram-negative rod bacteremia from a likely urinary source. She is clinically improving with ceftriaxone, remains stable and her white count has decreased.   RECOMMENDATIONS: Continue ceftriaxone pending further ID of culture results. She will likely need only a few days of IV antibiotics followed by transition to oral therapy based on sensitivities.   Thank you for the consult. I will be glad to follow with you.    ____________________________ Stann Mainlandavid P. Sampson GoonFitzgerald, MD dpf:TT D: 10/23/2014 20:23:38 ET T: 10/23/2014 20:55:33 ET JOB#: [id number]  cc: Stann Mainlandavid P. Sampson GoonFitzgerald, MD, <Dictator> Rendon Howell Sampson GoonFITZGERALD MD ELECTRONICALLY SIGNED 10/31/2014 21:00

## 2015-02-03 NOTE — Discharge Summary (Signed)
PATIENT NAME:  Kelly Boyd, Kelly Boyd MR#:  [id number] DATE OF BIRTH:  24-Mar-1923  DATE OF ADMISSION:  10/21/2014 DATE OF DISCHARGE:  10/25/2014   PRIMARY CARE PHYSICIAN: Leo GrosserNancy Boyd. Maloney, MD   DISCHARGE DIAGNOSES: 1. Urinary tract infection with leukocytosis.  2. Bacteremia   3. Poor oral intake and dementia.  4. L2 compression fracture.  5. Hyperlipidemia.   CONDITION: Stable.   CODE STATUS: DNR.  HOME MEDICATIONS: Please refer to the medication reconciliation list.   DIET: Low-sodium diet.   ACTIVITY: As tolerated.   FOLLOWUP CARE: Follow with PCP and Dr. Joice LoftsPoggi, orthopedic surgeon, within 1-2 weeks.   REASON FOR ADMISSION: Back pain.   HOSPITAL COURSE: The patient is an 81 year old Caucasian female with a history of hypertension, hyperlipidemia, and dementia, who presented to the ED with back pain for one week. In addition, the patient has poor oral intake. For a detailed history and physical examination, please refer to the admission note dictated by Dr. Clint GuyHower on admission date. Laboratory data showed WBC 16.9, BUN 52, creatinine 1.12. Urinalysis shows a UTI. X-ray of the lumbar spine showed L2 compression fracture.   1. Urinary tract infection within leukocytosis. After admission, the patient had been treated with Rocephin. The patient's urine culture and blood culture showed Escherichia coli. Dr. Sampson GoonFitzgerald suggested to continue Rocephin and start Cipro 500 mg b.i.d. for 10 days after discharge.  2. L2 compression fracture. The patient has been treated with oxycodone and Percocet  and back pain is better.  3. Poor oral intake and dementia.  4. Dehydration. The patient has been treated with normal saline IV, dehydration improved.  5. Dementia with delirium. The patient was agitated in the hospital. Dr. Toni Amendlapacs evaluated the patient, suggested to start Risperdal. The patient's agitation improved.  6. The patient needs placement of a skilled nursing facility. 7. The patient's vital signs  are stable. She is clinically stable and will be discharged to nursing home today. I discussed the patient's discharge plan with the patient, granddaughter, case Production designer, theatre/television/filmmanager, the Child psychotherapistsocial worker, and nurse.   TIME SPENT: About 42 minutes    ____________________________ Shaune PollackQing Ariyonna Twichell, MD qc:mw D: 10/25/2014 12:04:57 ET T: 10/25/2014 12:31:08 ET JOB#: [id number]  cc: Shaune PollackQing Athen Riel, MD, <Dictator> Shaune PollackQING Madigan Rosensteel MD ELECTRONICALLY SIGNED 10/25/2014 13:08

## 2015-02-03 NOTE — Consult Note (Signed)
Brief Consult Note: Diagnosis: L2 compression fracture.   Patient was seen by consultant.   Consult note dictated.   Recommend further assessment or treatment.   Orders entered.   Comments: We will try non-surgical treatment of the L2 compression fracture.  An L-S corset has been ordered for her to wear when OOB.  In addition, she should be assessed by PT for gait safety.  As soon as she is cleared by PT, she can be d/c'd home and follow-up in my office in 2-3 weeks.  Electronic Signatures: Ninfa LindenPoggi, Jeff (MD)  (Signed 18-Jan-16 13:26)  Authored: Brief Consult Note   Last Updated: 18-Jan-16 13:26 by Ninfa LindenPoggi, Jeff (MD)

## 2015-02-03 NOTE — Consult Note (Signed)
Brief Consult Note: Diagnosis: dementia with behavior disturbance. Evening delirium.   Patient was seen by consultant.   Consult note dictated.   Recommend further assessment or treatment.   Orders entered.   Comments: Psychiatry: PAtient seen and chart reviewed. Patient with dementia and evening delirium. See full note. Will provide low dose risperdal in the evening and possibly adjust dementia meds.  Electronic Signatures: Audery Amellapacs, John T (MD)  (Signed 20-Jan-16 13:16)  Authored: Brief Consult Note   Last Updated: 20-Jan-16 13:16 by Audery Amellapacs, John T (MD)

## 2015-02-03 NOTE — H&P (Signed)
PATIENT NAME:  Kelly Boyd, Kelly Boyd MR#:  [id number] DATE OF BIRTH:  1923/07/06  DATE OF ADMISSION:  10/21/2014  REFERRING PHYSICIAN: Lurena Joiner L. Shaune Pollack, MD  PRIMARY CARE PHYSICIAN: Leo Grosser, MD   CHIEF COMPLAINT: Back pain.   HISTORY OF PRESENT ILLNESS: An 81 year old Caucasian female with a history of essential hypertension; hyperlipidemia, unspecified; early dementia; presenting with back pain. She is unable to provide any meaningful information given mental status and medical condition. History obtained from family who are present at bedside, state that she had back pain for about 1 week in total duration to the point where she was essentially not ambulating. Saw her PCP for the above symptoms, given tramadol without any relief. No preceding fall or trauma noted. As far as pain is concerned, located in the mid back with some questionable radiation down the right leg, worse with moving, no relieving factors. Family also attests to her having poor p.o. intake for the same duration, and slightly more confusion than usual.  REVIEW OF SYSTEMS: Unable to obtain given the patient's mental status and medical condition.   PAST MEDICAL HISTORY: Essential hypertension; hyperlipidemia, unspecified; as well as the dementia.   SOCIAL HISTORY: No alcohol, tobacco, or drug usage.   FAMILY HISTORY: No known cardiovascular or pulmonary disorders.   ALLERGIES: No known drug allergies.   HOME MEDICATIONS: Include tramadol 50 mg p.o. q. 6 hours as needed for pain, Lipitor 10 mg p.o. at bedtime, oxybutynin 5 mg p.o. b.i.d., multivitamin 1 tab p.o. q. daily, aspirin 81 mg p.o. q. daily.   PHYSICAL EXAMINATION:  VITAL SIGNS: Temperature 98.2, heart rate 56, respirations 18, blood pressure 144/70, saturating 95% on room air. Weight 58.5 kg, BMI 28.  GENERAL: Kelly Boyd Caucasian female currently in no acute distress.  HEAD: Normocephalic, atraumatic.  EYES: Pupils equal, round, reactive to light.  Extraocular muscles intact. No scleral icterus.  MOUTH: Moist mucosal membranes. Dentition intact. No abscess noted. EAR, NOSE, THROAT: Clear without exudates. No external lesions.  NECK: Supple. No thyromegaly. No nodules. No JVD.  PULMONARY: Clear to auscultation bilaterally without wheezes, rales, rhonchi. No use of accessory muscles. Good respiratory effort.  CHEST: Nontender to palpation.  CARDIOVASCULAR: S1 and S2, regular rate and rhythm. No murmurs, rubs, or gallops. No edema. Pedal pulses 2+ bilaterally.  GASTROINTESTINAL: Soft, nontender, nondistended. No masses. Positive bowel sounds. No hepatosplenomegaly.  MUSCULOSKELETAL: No swelling, clubbing, or edema. Range of motion full in all extremities.  NEUROLOGIC: Cranial nerves II through XII intact. No gross focal neurological deficits. Sensation intact. Reflexes intact.  SKIN: No ulceration, lesions, rashes, or cyanosis. Skin warm, dry. Turgor intact.  PSYCHIATRIC: Mood and affect: She is somewhat agitated. She is awake, oriented to herself only, and refuses to answer the majority of questions. Insight and judgment appear to be poor at this time.   LABORATORY DATA: Sodium of 137, potassium of 3, chloride 95, bicarbonate of 33, BUN 52, creatinine of 1.12, glucose of 115. WBC of 15.9,  platelets of 169,000. Urinalysis: WBCs 3, leukocyte esterase negative, nitrite positive, epithelial cells less than 1. X-ray of the lumbar spine reveals age-indeterminate superior endplate compression of L2.   ASSESSMENT AND PLAN: An 81 year old Caucasian female with a history of essential hypertension and hyperlipidemia, unspecified; and early dementia, presenting with back pain and found to have a compression fracture. 1.  L2 compression fracture: Provide pain medications as required. Initiate bowel regimen. Get orthopedic evaluation as well as physical therapy.  2.  Urinary tract infection: site  unspecified. Ceftriaxone for antibiotic coverage. 3.   Hypokalemia: Replace the potassium, goal 4 to 5. 4.  Hyperlipidemia unspecified: Lipitor. 5.  Venous thromboembolism prophylaxis with heparin subcutaneous.  CODE STATUS: The patient is full code.   TIME SPENT: 45 minutes.    ____________________________ Cletis Athensavid K. Odean Fester, MD dkh:ST D: 10/21/2014 22:22:29 ET T: 10/21/2014 22:51:30 ET JOB#: [id number]  cc: Cletis Athensavid K. Reshonda Koerber, MD, <Dictator> Kimimila Tauzin Synetta ShadowK Yazmen Briones MD ELECTRONICALLY SIGNED 10/22/2014 1:06

## 2015-02-03 NOTE — Consult Note (Signed)
PATIENT NAME:  Kelly Boyd, Kelly Boyd MR#:  [id number] DATE OF BIRTH:  August 22, 1923  DATE OF CONSULTATION:  10/24/2014  REFERRING PHYSICIAN:   CONSULTING PHYSICIAN:  Audery AmelJohn T. Clapacs, MD  IDENTIFYING INFORMATION AND REASON FOR CONSULTATION:  This is an 81 year old woman who was admitted with back pain and some confusion, who has a bacteremia probably from a urinary tract infection. Consult for confusion and dementia.   HISTORY OF PRESENT ILLNESS: Information obtained from the patient and the chart. Chart and family who are present indicate that the patient has had sundowning kind of activity where she has gotten agitated and confused at night. Family also indicates that she is more irritable and a little more confused during the day even now than she is at baseline. Today seems to be an improvement over the worst that it was yesterday. The patient herself has no insight into this. She does not understand why she is in the hospital. Does not have any psychiatric complaints. There is no indication of any suicidality or homicidality. No indication of any psychotic disorder. Symptoms appeared to be fairly acute although she has a known level of dementia and confusion.   PAST PSYCHIATRIC HISTORY: No known past psychiatric history. No history of mood disorder or psychotic disorder. Does have an established diagnosis of dementia. The patient does not seem to have been on any psychiatric medicine when she was admitted including as far as I can tell not being on any medication at that time for dementia.   FAMILY HISTORY: No known family history of significant mental illness.   SOCIAL HISTORY: The patient is currently residing with her daughter and her daughter's family. They report that she has been living with them for about 4 years now. The patient thinks she only came down here to [place] about a month ago. She has 2 adult children, 1 of whom lives here in the [place] area, but both children and their  spouses are in the room at this time.   PAST MEDICAL HISTORY: History of hypertension, dyslipidemia, a history of dementia, history of compression fracture with chronic pain, acute urinary tract infection with bacteremia.   SUBSTANCE ABUSE HISTORY: No history of alcohol or drug abuse.  REVIEW OF SYSTEMS:  The patient denies any specific acute pain. Says that she is feeling all right except she is irritated to be locked up in the hospital. Does not offer any other specific positive review of systems.   MENTAL STATUS EXAMINATION: Slightly disheveled but probably normal for a hospitalized patient. An 81 year old woman, looks her stated age. Cooperative as much as she can be at this time with the interview. Eye contact fairly good. Psychomotor activity normal. Speech is loud but I suspect she is a little hard of hearing. Decreased in total amount. Affect has some irritability to it, but is not hostile. Mood stated as being bad that she is locked up. Thoughts are disorganized. Frequently confused. Off track easily. Very confused about her current situation. The patient did not know where she was, did not know the year or the date. She could repeat 3 words only with quite a bit of effort and remembered none of them after just about 2 minutes. Denies suicidal or homicidal ideation. Denies hallucinations. Judgment and insight poor. Cognitive function impaired. I did not follow through with a full Mini-Mental Status exam at this time. The patient clearly has extensive dementia.   LABORATORY RESULTS: Calcium today slightly low at 6.4, but otherwise chemistry panel unremarkable.  CBC from yesterday shows essentially normal, slightly low platelet count at 138,000. Has Escherichia coli grown from her urine, has been on treatment for several days.   VITAL SIGNS: Blood pressure currently 130/57, respirations 18, pulse 84, temperature 99.   ASSESSMENT: This is an 81 year old woman with probably at least moderate  dementia and sundowning or delirium phenomenon related to hospitalization and infection. Outside the hospital she hopefully will not require medication for delirium. At this time I have prescribed Risperdal 0.5 mg at 5:00 p.m. to try and prevent the agitation and increased the trazodone that had been ordered for her to 50 mg at night. Psychoeducation completed. She is already on Aricept at 10 mg a day which is an adequate starting dose since she had not been on medicine before. The patient does not necessarily need psychiatric followup after discharge.   DIAGNOSIS PRINCIPAL AND PRIMARY:   AXIS I: Delirium from sepsis.   SECONDARY DIAGNOSES:  AXIS I: Dementia with behavioral disturbance.   AXIS II: Deferred.  AXIS III: Urosepsis.    ____________________________ Audery Amel, MD jtc:bu D: 10/24/2014 18:26:42 ET T: 10/24/2014 18:56:04 ET JOB#: [id number]  cc: Audery Amel, MD, <Dictator> Audery Amel MD ELECTRONICALLY SIGNED 11/14/2014 17:19

## 2015-02-03 DEATH — deceased
